# Patient Record
Sex: Male | Born: 1965 | Race: White | Hispanic: No | Marital: Single | Smoking: Never smoker
Health system: Southern US, Community
[De-identification: ages and names within clinical notes are randomized; demographics above are authoritative.]

---

## 2014-02-20 ENCOUNTER — Emergency Department (HOSPITAL_COMMUNITY)
Admission: EM | Admit: 2014-02-20 | Discharge: 2014-02-20 | Disposition: A | Discharge status: Home / Self Care | Payer: Self-pay | Attending: Emergency Medicine | Admitting: Emergency Medicine

## 2014-02-20 ENCOUNTER — Emergency Department (HOSPITAL_COMMUNITY): Payer: Self-pay

## 2014-02-20 ENCOUNTER — Encounter (HOSPITAL_COMMUNITY): Payer: Self-pay | Admitting: Emergency Medicine

## 2014-02-20 DIAGNOSIS — M549 Dorsalgia, unspecified: Secondary | ICD-10-CM | POA: Insufficient documentation

## 2014-02-20 DIAGNOSIS — R05 Cough: Secondary | ICD-10-CM

## 2014-02-20 DIAGNOSIS — M940 Chondrocostal junction syndrome [Tietze]: Secondary | ICD-10-CM | POA: Insufficient documentation

## 2014-02-20 DIAGNOSIS — R059 Cough, unspecified: Secondary | ICD-10-CM

## 2014-02-20 MED ORDER — BENZONATATE 100 MG PO CAPS: 100.0000 mg | ORAL_CAPSULE | Freq: Three times a day (TID) | ORAL | Status: AC | PRN

## 2014-02-20 MED ORDER — ACETAMINOPHEN-CODEINE #3 300-30 MG PO TABS
1.0000 | ORAL_TABLET | Freq: Four times a day (QID) | ORAL | Status: AC | PRN
Start: 2014-02-20 — End: ?

## 2014-02-20 NOTE — Discharge Instructions (Signed)
Costochondritis Costochondritis, sometimes called Tietze syndrome, is a swelling and irritation (inflammation) of the tissue (cartilage) that connects your ribs with your breastbone (sternum). It causes pain in the chest and rib area. Costochondritis usually goes away on its own over time. It can take up to 6 weeks or longer to get better, especially if you are unable to limit your activities. CAUSES  Some cases of costochondritis have no known cause. Possible causes include:  Injury (trauma).  Exercise or activity such as lifting.  Severe coughing. SIGNS AND SYMPTOMS  Pain and tenderness in the chest and rib area.  Pain that gets worse when coughing or taking deep breaths.  Pain that gets worse with specific movements. DIAGNOSIS  Your health care provider will do a physical exam and ask about your symptoms. Chest X-rays or other tests may be done to rule out other problems. TREATMENT  Costochondritis usually goes away on its own over time. Your health care provider may prescribe medicine to help relieve pain. HOME CARE INSTRUCTIONS   Avoid exhausting physical activity. Try not to strain your ribs during normal activity. This would include any activities using chest, abdominal, and side muscles, especially if heavy weights are used.  Apply ice to the affected area for the first 2 days after the pain begins.  Put ice in a plastic bag.  Place a towel between your skin and the bag.  Leave the ice on for 20 minutes, 2-3 times a day.  Only take over-the-counter or prescription medicines as directed by your health care provider. SEEK MEDICAL CARE IF:  You have redness or swelling at the rib joints. These are signs of infection.  Your pain does not go away despite rest or medicine. SEEK IMMEDIATE MEDICAL CARE IF:   Your pain increases or you are very uncomfortable.  You have shortness of breath or difficulty breathing.  You cough up blood.  You have worse chest pains,  sweating, or vomiting.  You have a fever or persistent symptoms for more than 2-3 days.  You have a fever and your symptoms suddenly get worse. MAKE SURE YOU:   Understand these instructions.  Will watch your condition.  Will get help right away if you are not doing well or get worse. Document Released: 11/16/2004 Document Revised: 11/27/2012 Document Reviewed: 09/10/2012 [employer] Patient Information 2015 [employer]. This information is not intended to replace advice given to you by your health care provider. Make sure you discuss any questions you have with your health care provider.  

## 2014-02-20 NOTE — ED Notes (Signed)
Pt states that he has been having a cough x 3 wks.  States that today, he coughed so hard that he "tweaked" his back.

## 2014-02-20 NOTE — ED Provider Notes (Signed)
CSN: [id number]     Arrival date & time 02/20/14  1906 History  This chart was scribed for Fayrene Helper, PA-C with Suzi Roots, MD by Tonye Royalty, ED Scribe. This patient was seen in room WTR5/WTR5 and the patient's care was started at 7:22 PM.    Chief Complaint  Patient presents with  . Back Pain  . Cough   Patient is a 49 y.o. male presenting with cough. The history is provided by the patient. No language interpreter was used.  Cough Associated symptoms: no chills, no fever, no rhinorrhea, no shortness of breath and no sore throat     HPI Comments: Christopher Klein is a 49 y.o. male who presents to the Emergency Department complaining of right-sided back pain with onset last night. He notes he has been coughing persistently for 3 weeks and states his pain is worse with coughing. He states cough was worst the first week and has improved, but is still persistent. He states cough is worse with exertion. He states he has not been evaluated for his cough. He states he has used OTC cough medication only. He states his cough produces phlegm but no blood. He denies history of smoking, asthma, or COPD. He denies SOB, fever, chills, rhinorrhea, sneezing, sore throat, difficulty urinating, or hematuria.  History reviewed. No pertinent past medical history. History reviewed. No pertinent past surgical history. History reviewed. No pertinent family history. History  Substance Use Topics  . Smoking status: Never Smoker   . Smokeless tobacco: Not on file  . Alcohol Use: No    Review of Systems  Constitutional: Negative for fever and chills.  HENT: Negative for rhinorrhea, sneezing and sore throat.   Respiratory: Positive for cough. Negative for shortness of breath.   Genitourinary: Negative for hematuria and difficulty urinating.  Musculoskeletal: Positive for back pain.      Allergies  Review of patient's allergies indicates no known allergies.  Home Medications   Prior to Admission  medications   Not on File   BP 166/93 mmHg  Pulse 108  Temp(Src) 98.5 F (36.9 C) (Oral)  Resp 18  SpO2 100% Physical Exam  Constitutional: He is oriented to person, place, and time. He appears well-developed and well-nourished.  HENT:  Head: Normocephalic and atraumatic.  Eyes: Conjunctivae are normal.  Neck: Normal range of motion. Neck supple.  Cardiovascular: Normal rate, regular rhythm and normal heart sounds.   No murmur heard. Pulmonary/Chest: Effort normal and breath sounds normal. No respiratory distress. He has no wheezes. He has no rales.  Musculoskeletal: Normal range of motion.  No significant midline spine tenderness  No CVA tenderness  Neurological: He is alert and oriented to person, place, and time.  Skin: Skin is warm and dry.  Psychiatric: He has a normal mood and affect.  Nursing note and vitals reviewed.   ED Course  Procedures (including critical care time)  DIAGNOSTIC STUDIES: Oxygen Saturation is 100% on room air, normal by my interpretation.    COORDINATION OF CARE: 7:25 PM Discussed treatment plan with patient at beside, including chest x-ray to look for pneumonia since he has been coughing for so long. the patient agrees with the plan and has no further questions at this time.  7:56 PM Costochondritis from persistent coughing.  Xray without acute infiltrates concerning for pna.  Will provide cough medication.  Return precaution discussed.  Doubt kidney stone, PE, drugs allergy or other acute emergent condition.     Labs Review Labs Reviewed -  No data to display  Imaging Review Dg Chest 2 View  02/20/2014   CLINICAL DATA:  Acute onset of right-sided back pain, with persistent subacute onset of cough. Initial encounter.  EXAM: CHEST  2 VIEW  COMPARISON:  None.  FINDINGS: The lungs are relatively well-aerated. Pulmonary vascularity is at the upper limits of normal. Minimal right basilar atelectasis is noted. There is no evidence of pleural effusion  or pneumothorax.  The heart is normal in size; the mediastinal contour is within normal limits. No acute osseous abnormalities are seen.  IMPRESSION: Minimal right basilar atelectasis noted; lungs otherwise grossly clear.   Electronically Signed   By: Roanna Raider M.D.   On: 02/20/2014 19:53     EKG Interpretation None      MDM   Final diagnoses:  Costochondritis, acute    BP 166/93 mmHg  Pulse 108  Temp(Src) 98.5 F (36.9 C) (Oral)  Resp 18  SpO2 100%tachycardia likely 2/2 to pain and not likely related to PE.  No hypoxia  I have reviewed nursing notes and vital signs. I personally reviewed the imaging tests through PACS system  I reviewed available ER/hospitalization records thought the EMR   I personally performed the services described in this documentation, which was scribed in my presence. The recorded information has been reviewed and is accurate.     Fayrene Helper, PA-C 02/20/14 2001  Suzi Roots, MD 02/21/14 [phone]

## 2016-02-12 IMAGING — CR DG CHEST 2V
2 series · 2 of 2 positions shown · non-contrast
Comparison: None.

CLINICAL DATA: Acute onset of right-sided back pain, with
persistent subacute onset of cough. Initial encounter.

EXAM:
CHEST  2 VIEW

[w chest pa]
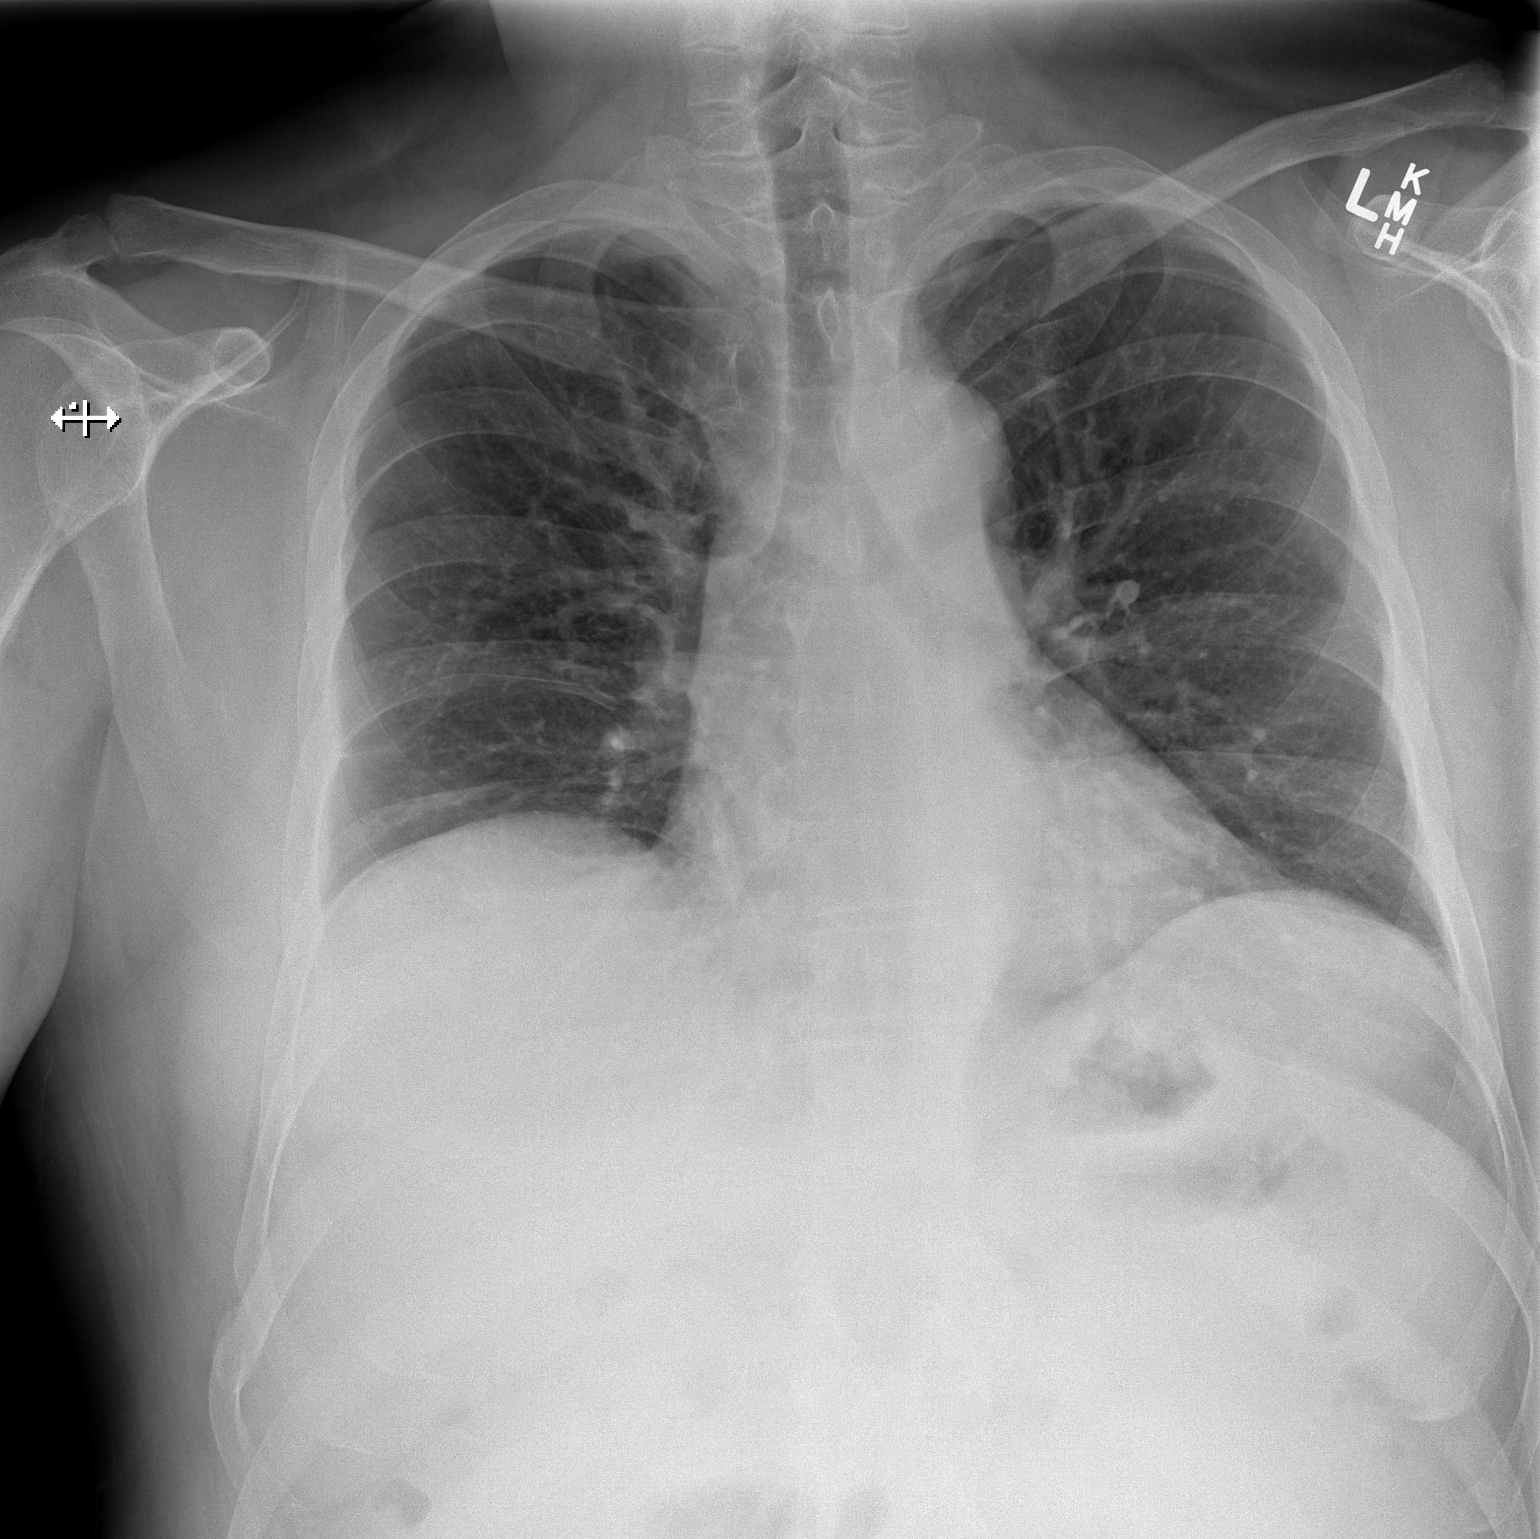

[w chest lat]
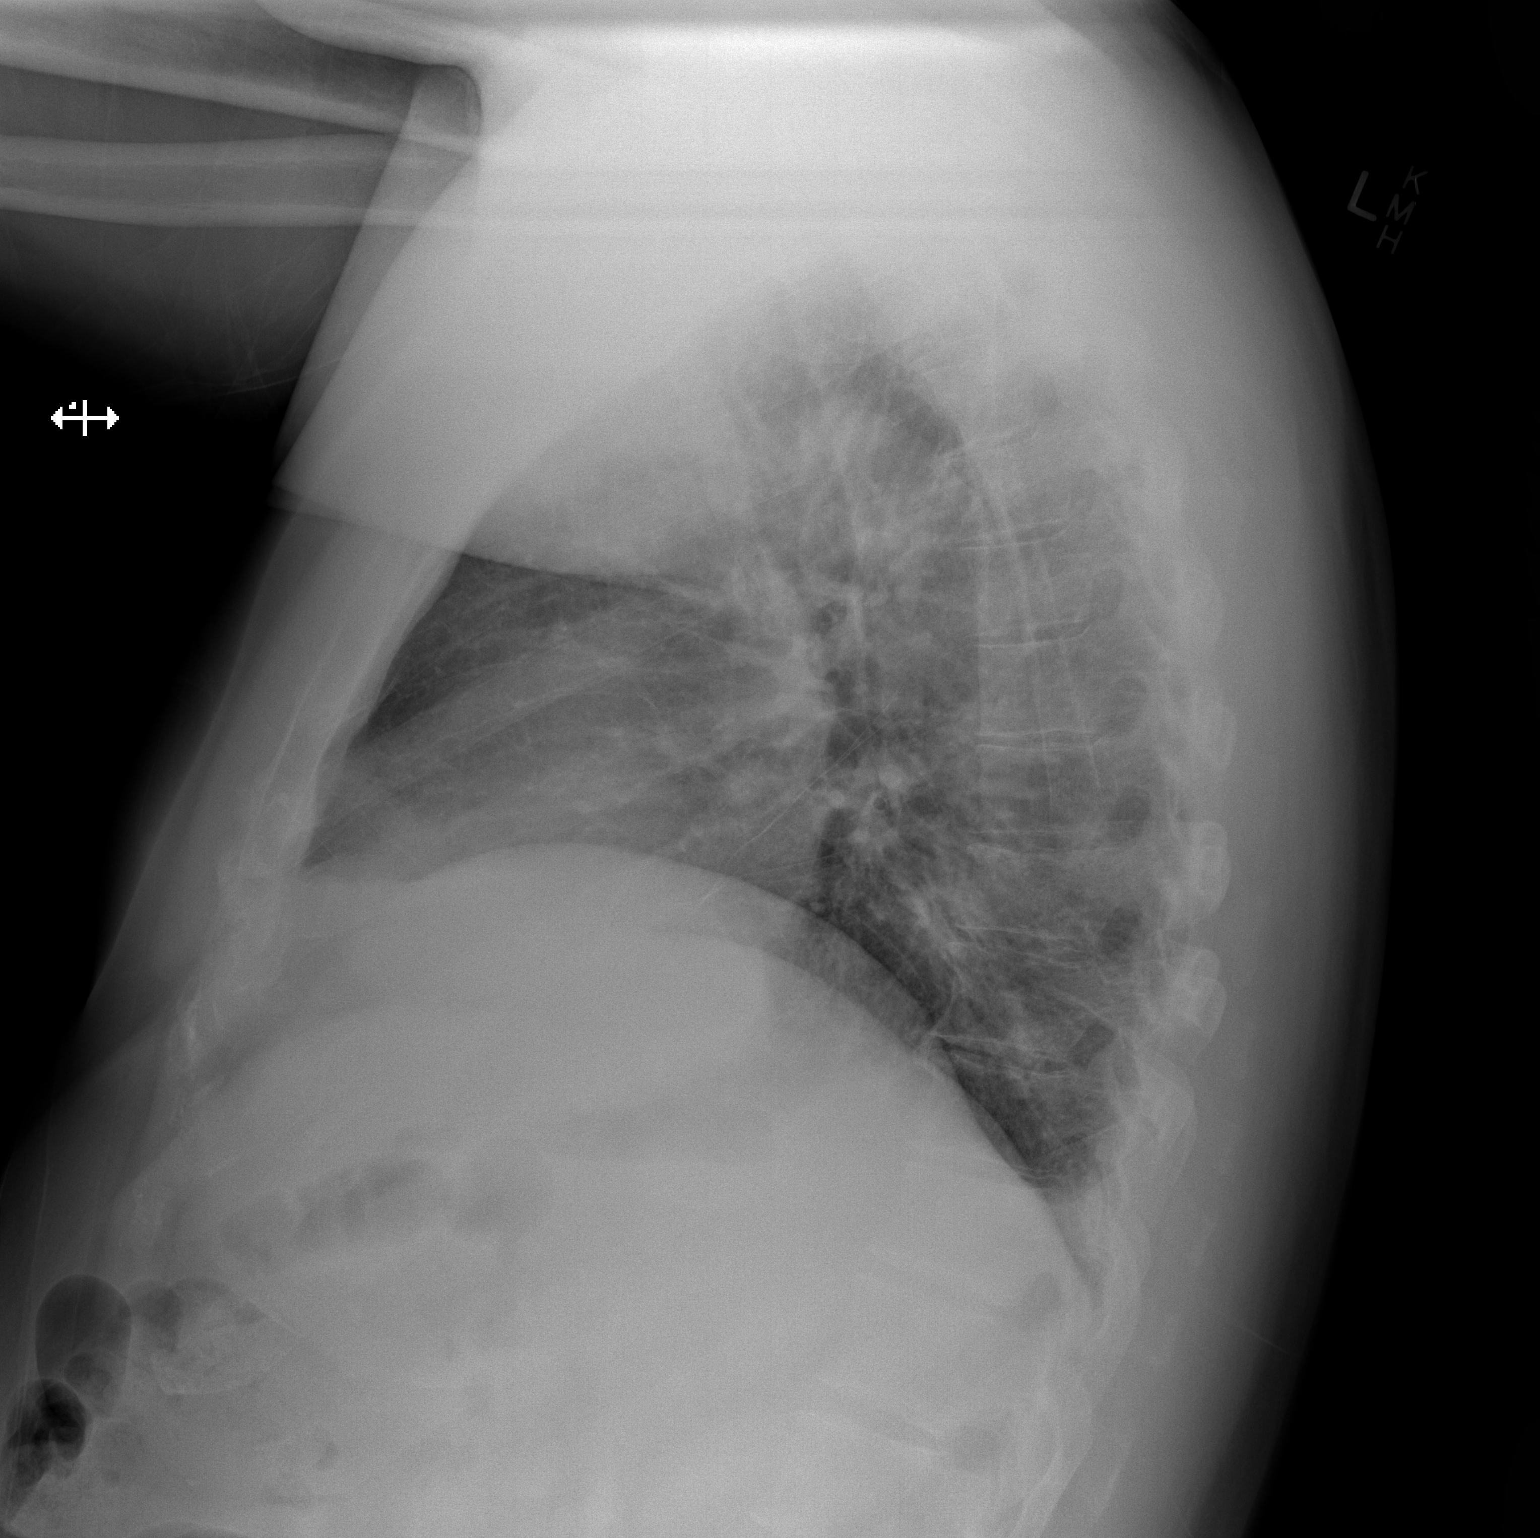

[2 of 2 positions shown; findings below may reference images not displayed]

FINDINGS: The lungs are relatively well-aerated. Pulmonary vascularity is at
the upper limits of normal. Minimal right basilar atelectasis is
noted. There is no evidence of pleural effusion or pneumothorax.

The heart is normal in size; the mediastinal contour is within
normal limits. No acute osseous abnormalities are seen.
IMPRESSION: Minimal right basilar atelectasis noted; lungs otherwise grossly
clear.
